# Patient Record
Sex: Male | Born: 1965 | Hispanic: No | Marital: Married | Smoking: Never smoker
Health system: Southern US, Community
[De-identification: ages and names within clinical notes are randomized; demographics above are authoritative.]

## PROBLEM LIST (undated history)

## (undated) DIAGNOSIS — K922 Gastrointestinal hemorrhage, unspecified: Secondary | ICD-10-CM

## (undated) DIAGNOSIS — D649 Anemia, unspecified: Secondary | ICD-10-CM

## (undated) DIAGNOSIS — K219 Gastro-esophageal reflux disease without esophagitis: Secondary | ICD-10-CM

---

## 2014-05-10 ENCOUNTER — Encounter (HOSPITAL_COMMUNITY): Payer: Self-pay

## 2014-05-10 ENCOUNTER — Observation Stay (HOSPITAL_COMMUNITY)
Admission: AD | Admit: 2014-05-10 | Discharge: 2014-05-12 | Disposition: A | Discharge status: Home / Self Care | Payer: Medicaid Other | Source: Ambulatory Visit | Attending: Cardiovascular Disease | Admitting: Cardiovascular Disease

## 2014-05-10 ENCOUNTER — Observation Stay (HOSPITAL_COMMUNITY): Payer: Medicaid Other

## 2014-05-10 DIAGNOSIS — K921 Melena: Principal | ICD-10-CM | POA: Insufficient documentation

## 2014-05-10 DIAGNOSIS — E785 Hyperlipidemia, unspecified: Secondary | ICD-10-CM | POA: Insufficient documentation

## 2014-05-10 DIAGNOSIS — D649 Anemia, unspecified: Secondary | ICD-10-CM | POA: Insufficient documentation

## 2014-05-10 DIAGNOSIS — K269 Duodenal ulcer, unspecified as acute or chronic, without hemorrhage or perforation: Secondary | ICD-10-CM | POA: Insufficient documentation

## 2014-05-10 DIAGNOSIS — A048 Other specified bacterial intestinal infections: Secondary | ICD-10-CM | POA: Insufficient documentation

## 2014-05-10 DIAGNOSIS — K2940 Chronic atrophic gastritis without bleeding: Secondary | ICD-10-CM | POA: Insufficient documentation

## 2014-05-10 DIAGNOSIS — K5900 Constipation, unspecified: Secondary | ICD-10-CM | POA: Insufficient documentation

## 2014-05-10 DIAGNOSIS — K219 Gastro-esophageal reflux disease without esophagitis: Secondary | ICD-10-CM | POA: Insufficient documentation

## 2014-05-10 DIAGNOSIS — E119 Type 2 diabetes mellitus without complications: Secondary | ICD-10-CM | POA: Insufficient documentation

## 2014-05-10 DIAGNOSIS — K922 Gastrointestinal hemorrhage, unspecified: Secondary | ICD-10-CM | POA: Diagnosis present

## 2014-05-10 DIAGNOSIS — E8809 Other disorders of plasma-protein metabolism, not elsewhere classified: Secondary | ICD-10-CM | POA: Diagnosis not present

## 2014-05-10 HISTORY — DX: Gastro-esophageal reflux disease without esophagitis: K21.9

## 2014-05-10 HISTORY — DX: Gastrointestinal hemorrhage, unspecified: K92.2

## 2014-05-10 HISTORY — DX: Anemia, unspecified: D64.9

## 2014-05-10 LAB — CBC WITH DIFFERENTIAL/PLATELET
Basophils Absolute: 0 10*3/uL (ref 0.0–0.1)
Basophils Relative: 1 % (ref 0–1)
EOS PCT: 1 % (ref 0–5)
Eosinophils Absolute: 0.1 10*3/uL (ref 0.0–0.7)
HCT: 15.9 % — ABNORMAL LOW (ref 39.0–52.0)
HEMOGLOBIN: 5.1 g/dL — AB (ref 13.0–17.0)
LYMPHS ABS: 2.5 10*3/uL (ref 0.7–4.0)
LYMPHS PCT: 30 % (ref 12–46)
MCH: 29.8 pg (ref 26.0–34.0)
MCHC: 32.1 g/dL (ref 30.0–36.0)
MCV: 93 fL (ref 78.0–100.0)
Monocytes Absolute: 0.6 10*3/uL (ref 0.1–1.0)
Monocytes Relative: 7 % (ref 3–12)
NEUTROS PCT: 61 % (ref 43–77)
Neutro Abs: 5.2 10*3/uL (ref 1.7–7.7)
Platelets: 233 10*3/uL (ref 150–400)
RBC: 1.71 MIL/uL — AB (ref 4.22–5.81)
RDW: 14.6 % (ref 11.5–15.5)
WBC: 8.5 10*3/uL (ref 4.0–10.5)

## 2014-05-10 LAB — GLUCOSE, CAPILLARY: Glucose-Capillary: 160 mg/dL — ABNORMAL HIGH (ref 70–99)

## 2014-05-10 LAB — COMPREHENSIVE METABOLIC PANEL
ALBUMIN: 3 g/dL — AB (ref 3.5–5.2)
ALT: 22 U/L (ref 0–53)
AST: 17 U/L (ref 0–37)
Alkaline Phosphatase: 55 U/L (ref 39–117)
Anion gap: 11 (ref 5–15)
BUN: 9 mg/dL (ref 6–23)
CO2: 23 mEq/L (ref 19–32)
Calcium: 7.9 mg/dL — ABNORMAL LOW (ref 8.4–10.5)
Chloride: 104 mEq/L (ref 96–112)
Creatinine, Ser: 0.78 mg/dL (ref 0.50–1.35)
GFR calc Af Amer: >90 mL/min (ref >90)
GFR calc non Af Amer: >90 mL/min (ref >90)
Glucose, Bld: 191 mg/dL — ABNORMAL HIGH (ref 70–99)
Potassium: 3.7 mEq/L (ref 3.7–5.3)
SODIUM: 138 meq/L (ref 137–147)
Total Bilirubin: <0.2 mg/dL — ABNORMAL LOW (ref 0.3–1.2)
Total Protein: 5.4 g/dL — ABNORMAL LOW (ref 6.0–8.3)

## 2014-05-10 LAB — PROTIME-INR
INR: 1.12 (ref 0.00–1.49)
PROTHROMBIN TIME: 14.4 s (ref 11.6–15.2)

## 2014-05-10 LAB — PREPARE RBC (CROSSMATCH)

## 2014-05-10 LAB — OCCULT BLOOD X 1 CARD TO LAB, STOOL: FECAL OCCULT BLD: POSITIVE — AB

## 2014-05-10 LAB — ABO/RH: ABO/RH(D): B POS

## 2014-05-10 LAB — APTT: aPTT: 25 seconds (ref 24–37)

## 2014-05-10 MED ORDER — PANTOPRAZOLE SODIUM 40 MG IV SOLR
40.0000 mg | INTRAVENOUS | Status: DC
  Administered 2014-05-10 – 2014-05-11 (×2): 40 mg via INTRAVENOUS
  Filled 2014-05-10 (×3): qty 40

## 2014-05-10 MED ORDER — SODIUM CHLORIDE 0.9 % IJ SOLN
3.0000 mL | Freq: Two times a day (BID) | INTRAMUSCULAR | Status: DC
  Administered 2014-05-10 – 2014-05-12 (×2): 3 mL via INTRAVENOUS

## 2014-05-10 MED ORDER — ACETAMINOPHEN 650 MG RE SUPP: 650.0000 mg | Freq: Four times a day (QID) | RECTAL | Status: DC | PRN

## 2014-05-10 MED ORDER — ONDANSETRON HCL 4 MG/2ML IJ SOLN: 4.0000 mg | Freq: Four times a day (QID) | INTRAMUSCULAR | Status: DC | PRN

## 2014-05-10 MED ORDER — ACETAMINOPHEN 325 MG PO TABS: 650.0000 mg | ORAL_TABLET | Freq: Four times a day (QID) | ORAL | Status: DC | PRN

## 2014-05-10 MED ORDER — SODIUM CHLORIDE 0.9 % IV SOLN
INTRAVENOUS | Status: DC
  Administered 2014-05-10 – 2014-05-11 (×3): via INTRAVENOUS
  Administered 2014-05-12: 1000 mL via INTRAVENOUS

## 2014-05-10 MED ORDER — ONDANSETRON HCL 4 MG PO TABS
4.0000 mg | ORAL_TABLET | Freq: Four times a day (QID) | ORAL | Status: DC | PRN
Start: 2014-05-10 — End: 2014-05-12

## 2014-05-10 NOTE — Consult Note (Signed)
Unassigned patient Reason for Consult: Black stools and anemia.  Referring Physician: Dr. Dixie Dials.  Allen Baldwin is an 48 y.o. male.  HPI: 48 year old [place] male, with a 2 day history of black tarry stools. He claims this happened once about 2 years ago but he did not make much of it. He suffers from constipation and uses herbs to help his symptoms. He denies having any BRBPR. He has taken an occasional Aleve to help with aches and pains but denies abusing NSAIDS. He has a good appetite and his weight has been stable. He has occasional periumbilical cramping before a BM but denies having any dysphagia, odynophagia, acid reflux, nausea or vomiting. He claims he has been very healthy all his life and has never had any medical problems. There is no known family history of colon cancer, celiac sprue or IBD.    Past Medical History  Diagnosis Date  . GERD (gastroesophageal reflux disease)    History reviewed. No pertinent past surgical history.  History reviewed. No pertinent family history.  Social History:  reports that he has never smoked. He quit smokeless tobacco use 4 days ago. His uses chewing tobacco. He reports that he does not drink alcohol or use illicit drugs. He works as a Scientist, clinical (histocompatibility and immunogenetics) at [employer].  Allergies: No Known Allergies  Medications: I have reviewed the patient's current medications.  Results for orders placed during the hospital encounter of 05/10/14 (from the past 48 hour(s))  OCCULT BLOOD X 1 CARD TO LAB, STOOL     Status: Abnormal   Collection Time    05/10/14 12:43 PM      Result Value Ref Range   Fecal Occult Bld POSITIVE (*) NEGATIVE  COMPREHENSIVE METABOLIC PANEL     Status: Abnormal   Collection Time    05/10/14  2:15 PM      Result Value Ref Range   Sodium 138  137 - 147 mEq/L   Potassium 3.7  3.7 - 5.3 mEq/L   Chloride 104  96 - 112 mEq/L   CO2 23  19 - 32 mEq/L   Glucose, Bld 191 (*) 70 - 99 mg/dL   BUN 9  6 - 23 mg/dL   Creatinine, Ser  0.78  0.50 - 1.35 mg/dL   Calcium 7.9 (*) 8.4 - 10.5 mg/dL   Total Protein 5.4 (*) 6.0 - 8.3 g/dL   Albumin 3.0 (*) 3.5 - 5.2 g/dL   AST 17  0 - 37 U/L   ALT 22  0 - 53 U/L   Alkaline Phosphatase 55  39 - 117 U/L   Total Bilirubin <0.2 (*) 0.3 - 1.2 mg/dL   GFR calc non Af Amer >90  >90 mL/min   GFR calc Af Amer >90  >90 mL/min   Comment: (NOTE)     The eGFR has been calculated using the CKD EPI equation.     This calculation has not been validated in all clinical situations.     eGFR's persistently <90 mL/min signify possible Chronic Kidney     Disease.   Anion gap 11  5 - 15  CBC WITH DIFFERENTIAL     Status: Abnormal   Collection Time    05/10/14  2:15 PM      Result Value Ref Range   WBC 8.5  4.0 - 10.5 K/uL   RBC 1.71 (*) 4.22 - 5.81 MIL/uL   Hemoglobin 5.1 (*) 13.0 - 17.0 g/dL   Comment: REPEATED TO VERIFY  SPECIMEN CHECKED FOR CLOTS     K MCALL,RN 1506 05/10/14 WBOND   HCT 15.9 (*) 39.0 - 52.0 %   MCV 93.0  78.0 - 100.0 fL   MCH 29.8  26.0 - 34.0 pg   MCHC 32.1  30.0 - 36.0 g/dL   RDW 14.6  11.5 - 15.5 %   Platelets 233  150 - 400 K/uL   Neutrophils Relative % 61  43 - 77 %   Neutro Abs 5.2  1.7 - 7.7 K/uL   Lymphocytes Relative 30  12 - 46 %   Lymphs Abs 2.5  0.7 - 4.0 K/uL   Monocytes Relative 7  3 - 12 %   Monocytes Absolute 0.6  0.1 - 1.0 K/uL   Eosinophils Relative 1  0 - 5 %   Eosinophils Absolute 0.1  0.0 - 0.7 K/uL   Basophils Relative 1  0 - 1 %   Basophils Absolute 0.0  0.0 - 0.1 K/uL  PROTIME-INR     Status: None   Collection Time    05/10/14  2:15 PM      Result Value Ref Range   Prothrombin Time 14.4  11.6 - 15.2 seconds   INR 1.12  0.00 - 1.49  APTT     Status: None   Collection Time    05/10/14  2:15 PM      Result Value Ref Range   aPTT 25  24 - 37 seconds  TYPE AND SCREEN     Status: None   Collection Time    05/10/14  2:15 PM      Result Value Ref Range   ABO/RH(D) B POS     Antibody Screen NEG     Sample Expiration 05/13/2014      Unit Number [id number]     Blood Component Type RED CELLS,LR     Unit division 00     Status of Unit ISSUED     Transfusion Status OK TO TRANSFUSE     Crossmatch Result Compatible     Unit Number [id number]     Blood Component Type RED CELLS,LR     Unit division 00     Status of Unit ALLOCATED     Transfusion Status OK TO TRANSFUSE     Crossmatch Result Compatible    PREPARE RBC (CROSSMATCH)     Status: None   Collection Time    05/10/14  2:15 PM      Result Value Ref Range   Order Confirmation ORDER PROCESSED BY BLOOD BANK    ABO/RH     Status: None   Collection Time    05/10/14  2:15 PM      Result Value Ref Range   ABO/RH(D) B POS     X-ray Chest Pa And Lateral   05/10/2014   CLINICAL DATA:  Dizziness  EXAM: CHEST  2 VIEW  COMPARISON:  None.  FINDINGS: Lungs are clear. Heart size and pulmonary vascularity are normal. No adenopathy. There is mild upper thoracic levoscoliosis.  IMPRESSION: No edema or consolidation.   Electronically Signed   By: Lowella Grip M.D.   On: 05/10/2014 13:16   Review of Systems  Constitutional: Negative.   HENT: Negative.   Eyes: Negative.   Respiratory: Negative.   Cardiovascular: Negative.   Gastrointestinal: Positive for constipation, blood in stool and melena. Negative for heartburn, nausea, vomiting, abdominal pain and diarrhea.  Genitourinary: Negative.   Skin: Negative.   Neurological: Negative.   Endo/Heme/Allergies: Negative.  Psychiatric/Behavioral: Negative.    Blood pressure 107/60, pulse 102, temperature 99.4 F (37.4 C), temperature source Oral, resp. rate 16, height 5' 7.5" (1.715 m), weight 65.772 kg (145 lb), SpO2 100.00%. Physical Exam  Constitutional: He is oriented to person, place, and time. He appears well-developed and well-nourished.  HENT:  Head: Normocephalic and atraumatic.  Eyes: Conjunctivae and EOM are normal. Pupils are equal, round, and reactive to light.  Neck: Normal range of motion. Neck  supple.  Cardiovascular: Normal rate and regular rhythm.   Respiratory: Effort normal and breath sounds normal.  GI: Soft. Bowel sounds are normal.  Musculoskeletal: Normal range of motion.  Neurological: He is alert and oriented to person, place, and time.  Skin: Skin is warm and dry.  Psychiatric: He has a normal mood and affect. His behavior is normal. Judgment and thought content normal.   Assessment/Plan: 1) Melenic stools with severe anemia: Will plan to do an EGD tomorrow. Transfuse 2 units of PRBC's.  2) Constipation-Colace 100 mg 2 PO QHS.  3) Hyperglycemia rule out diabetes; check fasting HbA1c.  4) Hypocalcemia/hypoalbuminemia.  Allen Baldwin 05/10/2014, 5:29 PM

## 2014-05-10 NOTE — H&P (Signed)
Referring Physician:  Maris BergerMitulkumar Baldwin is an 48 y.o. male.                       Chief Complaint: Dizziness  HPI: 48 year old male with 5 day history of dark stools and weakness and dizziness. Patient appears pale.  No past medical history on file. No history of diabetes, hypertension, prior GI or GU bleed. No smoking or alcohol intake.   No past surgical history on file.  No family history on file. Social History:  has no tobacco, alcohol, and drug history on file.  Allergies: Allergies not on file  No prescriptions prior to admission    No results found for this or any previous visit (from the past 48 hour(s)). No results found.  Review Of Systems No weight gain or loss, No vision change, No Asthma, No COPD, + dizziness, + chest pain, No GU bleed, No hepatitis, stroke or seizures or psychiatric admission.   Blood pressure 104/63, pulse 96, temperature 98.2 F (36.8 C), temperature source Oral, resp. rate 16, height 5' 7.5" (1.715 m), weight 65.772 kg (145 lb), SpO2 100.00%.  GENERAL: The patient is averagely built and nourished male in no acute distress.  HEENT: The patient is normocephalic, atraumatic with brown eyes. Pupils equally reactive to light. Extraocular movement intact. The patient wears glasses. Conjunctivae pale. Sclerae nonicteric.   NECK: No JVD, no carotid bruit.  LUNGS: Clear bilaterally.  HEART: Normal S1 and S2. II/VI systolic murmur.  ABDOMEN: Soft and nontender.  EXTREMITIES: No edema, cyanosis, or clubbing.  CNS: Cranial nerves grossly intact. Bilateral equal grips. The patient is right-handed.  SKIN: Dry and pale.  Assessment/Plan Acute upper GI bleed Severe anemia  Place in observation/Transfuse blood/GI consult post admission blood work results.  Ricki RodriguezKADAKIA,Charleen Madera S, MD  05/10/2014, 11:18 AM

## 2014-05-11 ENCOUNTER — Encounter (HOSPITAL_COMMUNITY)
Admission: AD | Disposition: A | Discharge status: Home / Self Care | Payer: Self-pay | Source: Ambulatory Visit | Attending: Cardiovascular Disease

## 2014-05-11 ENCOUNTER — Encounter (HOSPITAL_COMMUNITY): Payer: Self-pay | Admitting: *Deleted

## 2014-05-11 DIAGNOSIS — K921 Melena: Secondary | ICD-10-CM | POA: Diagnosis not present

## 2014-05-11 HISTORY — PX: ESOPHAGOGASTRODUODENOSCOPY: SHX5428

## 2014-05-11 LAB — PREPARE RBC (CROSSMATCH)

## 2014-05-11 LAB — BASIC METABOLIC PANEL
Anion gap: 8 (ref 5–15)
BUN: 7 mg/dL (ref 6–23)
CALCIUM: 8 mg/dL — AB (ref 8.4–10.5)
CO2: 24 mEq/L (ref 19–32)
Chloride: 109 mEq/L (ref 96–112)
Creatinine, Ser: 0.81 mg/dL (ref 0.50–1.35)
GFR calc Af Amer: >90 mL/min (ref >90)
GLUCOSE: 155 mg/dL — AB (ref 70–99)
Potassium: 4.1 mEq/L (ref 3.7–5.3)
Sodium: 141 mEq/L (ref 137–147)

## 2014-05-11 LAB — CBC
HEMATOCRIT: 23.8 % — AB (ref 39.0–52.0)
Hemoglobin: 7.9 g/dL — ABNORMAL LOW (ref 13.0–17.0)
MCH: 28.7 pg (ref 26.0–34.0)
MCHC: 33.2 g/dL (ref 30.0–36.0)
MCV: 86.5 fL (ref 78.0–100.0)
Platelets: 226 10*3/uL (ref 150–400)
RBC: 2.75 MIL/uL — ABNORMAL LOW (ref 4.22–5.81)
RDW: 18.6 % — ABNORMAL HIGH (ref 11.5–15.5)
WBC: 6.8 10*3/uL (ref 4.0–10.5)

## 2014-05-11 SURGERY — EGD (ESOPHAGOGASTRODUODENOSCOPY)
Anesthesia: Moderate Sedation | Laterality: Left

## 2014-05-11 MED ORDER — METFORMIN HCL 500 MG PO TABS
250.0000 mg | ORAL_TABLET | Freq: Every day | ORAL | Status: DC
  Administered 2014-05-12: 250 mg via ORAL
  Filled 2014-05-11 (×3): qty 1

## 2014-05-11 MED ORDER — SODIUM CHLORIDE 0.9 % IV SOLN: INTRAVENOUS | Status: DC

## 2014-05-11 MED ORDER — DIPHENHYDRAMINE HCL 50 MG/ML IJ SOLN
INTRAMUSCULAR | Status: AC
  Filled 2014-05-11: qty 1

## 2014-05-11 MED ORDER — FENTANYL CITRATE 0.05 MG/ML IJ SOLN
INTRAMUSCULAR | Status: AC
  Filled 2014-05-11: qty 2

## 2014-05-11 MED ORDER — FENTANYL CITRATE 0.05 MG/ML IJ SOLN
INTRAMUSCULAR | Status: DC | PRN
  Administered 2014-05-11 (×2): 25 ug via INTRAVENOUS

## 2014-05-11 MED ORDER — SODIUM CHLORIDE 0.9 % IV SOLN: Freq: Once | INTRAVENOUS | Status: DC

## 2014-05-11 MED ORDER — MIDAZOLAM HCL 10 MG/2ML IJ SOLN
INTRAMUSCULAR | Status: DC | PRN
  Administered 2014-05-11 (×2): 2 mg via INTRAVENOUS

## 2014-05-11 MED ORDER — MIDAZOLAM HCL 5 MG/ML IJ SOLN
INTRAMUSCULAR | Status: AC
  Filled 2014-05-11: qty 2

## 2014-05-11 NOTE — Progress Notes (Signed)
Ref: No PCP Per Patient   Subjective:  Feeling better. EGD today. Has healing duodenal ulcer.   Objective:  Vital Signs in the last 24 hours: Temp:  [98.1 F (36.7 C)-99.5 F (37.5 C)] 98.1 F (36.7 C) (08/13 1315) Pulse Rate:  [68-110] 68 (08/13 1514) Cardiac Rhythm:  [-]  Resp:  [10-24] 13 (08/13 1514) BP: (98-128)/(59-82) 104/67 mmHg (08/13 1514) SpO2:  [100 %] 100 % (08/13 1514) Weight:  [65 kg (143 lb 4.8 oz)] 65 kg (143 lb 4.8 oz) (08/13 0547)  Physical Exam: BP Readings from Last 1 Encounters:  05/11/14 104/67    Wt Readings from Last 1 Encounters:  05/11/14 65 kg (143 lb 4.8 oz)    Weight change:   HEENT: [place]/AT, Eyes-Brown, PERL, EOMI, Conjunctiva-Pale, Sclera-Non-icteric Neck: No JVD, No bruit, Trachea midline. Lungs:  Clear, Bilateral. Cardiac:  Regular rhythm, normal S1 and S2, no S3.  Abdomen:  Soft, non-tender. Extremities:  No edema present. No cyanosis. No clubbing. CNS: AxOx3, Cranial nerves grossly intact, moves all 4 extremities. Right handed. Skin: Warm and dry.   Intake/Output from previous day: 08/12 0701 - 08/13 0700 In: 1317.5 [I.V.:22.5; Blood:1295] Out: 2000 [Urine:2000]    Lab Results: BMET    Component Value Date/Time   NA 141 05/11/2014 0508   NA 138 05/10/2014 1415   K 4.1 05/11/2014 0508   K 3.7 05/10/2014 1415   CL 109 05/11/2014 0508   CL 104 05/10/2014 1415   CO2 24 05/11/2014 0508   CO2 23 05/10/2014 1415   GLUCOSE 155* 05/11/2014 0508   GLUCOSE 191* 05/10/2014 1415   BUN 7 05/11/2014 0508   BUN 9 05/10/2014 1415   CREATININE 0.81 05/11/2014 0508   CREATININE 0.78 05/10/2014 1415   CALCIUM 8.0* 05/11/2014 0508   CALCIUM 7.9* 05/10/2014 1415   GFRNONAA >90 05/11/2014 0508   GFRNONAA >90 05/10/2014 1415   GFRAA >90 05/11/2014 0508   GFRAA >90 05/10/2014 1415   CBC    Component Value Date/Time   WBC 6.8 05/11/2014 0508   RBC 2.75* 05/11/2014 0508   HGB 7.9* 05/11/2014 0508   HCT 23.8* 05/11/2014 0508   PLT 226 05/11/2014 0508   MCV 86.5  05/11/2014 0508   MCH 28.7 05/11/2014 0508   MCHC 33.2 05/11/2014 0508   RDW 18.6* 05/11/2014 0508   LYMPHSABS 2.5 05/10/2014 1415   MONOABS 0.6 05/10/2014 1415   EOSABS 0.1 05/10/2014 1415   BASOSABS 0.0 05/10/2014 1415   HEPATIC Function Panel  Recent Labs  05/10/14 1415  PROT 5.4*   HEMOGLOBIN A1C No components found with this basename: HGA1C,  MPG   CARDIAC ENZYMES No results found for this basename: CKTOTAL, CKMB, CKMBINDEX, TROPONINI   BNP No results found for this basename: PROBNP,  in the last 8760 hours TSH No results found for this basename: TSH,  in the last 8760 hours CHOLESTEROL No results found for this basename: CHOL,  in the last 8760 hours  Scheduled Meds: . sodium chloride   Intravenous Once  . metFORMIN  250 mg Oral Q breakfast  . pantoprazole (PROTONIX) IV  40 mg Intravenous Q24H  . sodium chloride  3 mL Intravenous Q12H   Continuous Infusions: . sodium chloride 75 mL/hr at 05/11/14 1238   PRN Meds:.acetaminophen, acetaminophen, ondansetron (ZOFRAN) IV, ondansetron  Assessment/Plan: Acute upper GI bleed Healing duodenal ulcer  Severe anemia  Continue Protonix. Patient understood dietary changes and possible triple antibiotic treatment if H. Pylori is positive. Also understood to use  Google and understand the disease and use relaxing Yogas to decrease stress with lifestyle modification and GI follow up.   LOS: 1 day    Orpah Cobb  MD  05/11/2014, 5:52 PM

## 2014-05-11 NOTE — Interval H&P Note (Signed)
History and Physical Interval Note:  05/11/2014 1:53 PM  Allen Baldwin  has presented today for surgery, with the diagnosis of Melena and anemia  The various methods of treatment have been discussed with the patient and family. After consideration of risks, benefits and other options for treatment, the patient has consented to  Procedure(s): ESOPHAGOGASTRODUODENOSCOPY (EGD) (Left) as a surgical intervention .  The patient's history has been reviewed, patient examined, no change in status, stable for surgery.  I have reviewed the patient's chart and labs.  Questions were answered to the patient's satisfaction.     [name] D

## 2014-05-11 NOTE — Progress Notes (Signed)
UR Completed Arnetia Bronk Graves-Bigelow, RN,BSN [phone]  

## 2014-05-11 NOTE — Op Note (Signed)
[facility] [address] [place], [id number]   OPERATIVE PROCEDURE REPORT  PATIENT: Allen Baldwin, Allen Baldwin  MR#: [id number] BIRTHDATE: Dec 12, 1965  GENDER: Male ENDOSCOPIST: Jeani HawkingPatrick Lanijah Warzecha, MD ASSISTANT:   Nilsa NuttingFelicia Akande, Endo Technician and Felecia ShellingWendy Myers, RN PROCEDURE DATE: 05/11/2014 PROCEDURE:   EGD w/ biopsy ASA CLASS:   Class II INDICATIONS:Melena. MEDICATIONS: Versed 4 mg IV and Fentanyl 50 mcg IV TOPICAL ANESTHETIC:   none  DESCRIPTION OF PROCEDURE:   After the risks benefits and alternatives of the procedure were thoroughly explained, informed consent was obtained.  The PENTAX GASTOROSCOPE [id number]  endoscope was introduced through the mouth  and advanced to the second portion of the duodenum Without limitations.      The instrument was slowly withdrawn as the mucosa was fully examined.      FINDINGS:  The esohpagus was normal.  No evidence of any esohpagitis.  The gastric lumen was also normal, i.e., no evidence of inflammation, ulcerations, erosions, masses, or vascular abnormalities.  The duodenal bulb reveals a healing ulcer with some surrouding inflammation.  Cold biopsies of the gastric mucosa were obtained to check for H. pylori.   Retroflexed views revealed no abnormalities.     The scope was then withdrawn from the patient and the procedure terminated.  COMPLICATIONS: There were no complications.  IMPRESSION: 1) Healing duodenal bulb ulcer.  RECOMMENDATIONS: 1) No NSAIDs. 2) PPI QD indefinitely. 3) Await biopsy results. 4) Okay to D/C Home.  Follow up with PCP.  _______________________________ eSigned:  Jeani HawkingPatrick Zynasia Burklow, MD 05/11/2014 2:49 PM

## 2014-05-11 NOTE — H&P (View-Only) (Signed)
Unassigned patient Reason for Consult: Black stools and anemia.  Referring Physician: Dr. Dixie Dials.  Allen Baldwin is an 48 y.o. male.  HPI: 48 year old [place] male, with a 2 day history of black tarry stools. He claims this happened once about 2 years ago but he did not make much of it. He suffers from constipation and uses herbs to help his symptoms. He denies having any BRBPR. He has taken an occasional Aleve to help with aches and pains but denies abusing NSAIDS. He has a good appetite and his weight has been stable. He has occasional periumbilical cramping before a BM but denies having any dysphagia, odynophagia, acid reflux, nausea or vomiting. He claims he has been very healthy all his life and has never had any medical problems. There is no known family history of colon cancer, celiac sprue or IBD.    Past Medical History  Diagnosis Date  . GERD (gastroesophageal reflux disease)    History reviewed. No pertinent past surgical history.  History reviewed. No pertinent family history.  Social History:  reports that he has never smoked. He quit smokeless tobacco use 4 days ago. His uses chewing tobacco. He reports that he does not drink alcohol or use illicit drugs. He works as a Scientist, clinical (histocompatibility and immunogenetics) at [employer].  Allergies: No Known Allergies  Medications: I have reviewed the patient's current medications.  Results for orders placed during the hospital encounter of 05/10/14 (from the past 48 hour(s))  OCCULT BLOOD X 1 CARD TO LAB, STOOL     Status: Abnormal   Collection Time    05/10/14 12:43 PM      Result Value Ref Range   Fecal Occult Bld POSITIVE (*) NEGATIVE  COMPREHENSIVE METABOLIC PANEL     Status: Abnormal   Collection Time    05/10/14  2:15 PM      Result Value Ref Range   Sodium 138  137 - 147 mEq/L   Potassium 3.7  3.7 - 5.3 mEq/L   Chloride 104  96 - 112 mEq/L   CO2 23  19 - 32 mEq/L   Glucose, Bld 191 (*) 70 - 99 mg/dL   BUN 9  6 - 23 mg/dL   Creatinine, Ser  0.78  0.50 - 1.35 mg/dL   Calcium 7.9 (*) 8.4 - 10.5 mg/dL   Total Protein 5.4 (*) 6.0 - 8.3 g/dL   Albumin 3.0 (*) 3.5 - 5.2 g/dL   AST 17  0 - 37 U/L   ALT 22  0 - 53 U/L   Alkaline Phosphatase 55  39 - 117 U/L   Total Bilirubin <0.2 (*) 0.3 - 1.2 mg/dL   GFR calc non Af Amer >90  >90 mL/min   GFR calc Af Amer >90  >90 mL/min   Comment: (NOTE)     The eGFR has been calculated using the CKD EPI equation.     This calculation has not been validated in all clinical situations.     eGFR's persistently <90 mL/min signify possible Chronic Kidney     Disease.   Anion gap 11  5 - 15  CBC WITH DIFFERENTIAL     Status: Abnormal   Collection Time    05/10/14  2:15 PM      Result Value Ref Range   WBC 8.5  4.0 - 10.5 K/uL   RBC 1.71 (*) 4.22 - 5.81 MIL/uL   Hemoglobin 5.1 (*) 13.0 - 17.0 g/dL   Comment: REPEATED TO VERIFY  SPECIMEN CHECKED FOR CLOTS     K MCALL,RN 1506 05/10/14 WBOND   HCT 15.9 (*) 39.0 - 52.0 %   MCV 93.0  78.0 - 100.0 fL   MCH 29.8  26.0 - 34.0 pg   MCHC 32.1  30.0 - 36.0 g/dL   RDW 14.6  11.5 - 15.5 %   Platelets 233  150 - 400 K/uL   Neutrophils Relative % 61  43 - 77 %   Neutro Abs 5.2  1.7 - 7.7 K/uL   Lymphocytes Relative 30  12 - 46 %   Lymphs Abs 2.5  0.7 - 4.0 K/uL   Monocytes Relative 7  3 - 12 %   Monocytes Absolute 0.6  0.1 - 1.0 K/uL   Eosinophils Relative 1  0 - 5 %   Eosinophils Absolute 0.1  0.0 - 0.7 K/uL   Basophils Relative 1  0 - 1 %   Basophils Absolute 0.0  0.0 - 0.1 K/uL  PROTIME-INR     Status: None   Collection Time    05/10/14  2:15 PM      Result Value Ref Range   Prothrombin Time 14.4  11.6 - 15.2 seconds   INR 1.12  0.00 - 1.49  APTT     Status: None   Collection Time    05/10/14  2:15 PM      Result Value Ref Range   aPTT 25  24 - 37 seconds  TYPE AND SCREEN     Status: None   Collection Time    05/10/14  2:15 PM      Result Value Ref Range   ABO/RH(D) B POS     Antibody Screen NEG     Sample Expiration 05/13/2014      Unit Number [id number]     Blood Component Type RED CELLS,LR     Unit division 00     Status of Unit ISSUED     Transfusion Status OK TO TRANSFUSE     Crossmatch Result Compatible     Unit Number [id number]     Blood Component Type RED CELLS,LR     Unit division 00     Status of Unit ALLOCATED     Transfusion Status OK TO TRANSFUSE     Crossmatch Result Compatible    PREPARE RBC (CROSSMATCH)     Status: None   Collection Time    05/10/14  2:15 PM      Result Value Ref Range   Order Confirmation ORDER PROCESSED BY BLOOD BANK    ABO/RH     Status: None   Collection Time    05/10/14  2:15 PM      Result Value Ref Range   ABO/RH(D) B POS     X-ray Chest Pa And Lateral   05/10/2014   CLINICAL DATA:  Dizziness  EXAM: CHEST  2 VIEW  COMPARISON:  None.  FINDINGS: Lungs are clear. Heart size and pulmonary vascularity are normal. No adenopathy. There is mild upper thoracic levoscoliosis.  IMPRESSION: No edema or consolidation.   Electronically Signed   By: William  Woodruff M.D.   On: 05/10/2014 13:16   Review of Systems  Constitutional: Negative.   HENT: Negative.   Eyes: Negative.   Respiratory: Negative.   Cardiovascular: Negative.   Gastrointestinal: Positive for constipation, blood in stool and melena. Negative for heartburn, nausea, vomiting, abdominal pain and diarrhea.  Genitourinary: Negative.   Skin: Negative.   Neurological: Negative.   Endo/Heme/Allergies: Negative.     Psychiatric/Behavioral: Negative.    Blood pressure 107/60, pulse 102, temperature 99.4 F (37.4 C), temperature source Oral, resp. rate 16, height 5' 7.5" (1.715 m), weight 65.772 kg (145 lb), SpO2 100.00%. Physical Exam  Constitutional: He is oriented to person, place, and time. He appears well-developed and well-nourished.  HENT:  Head: Normocephalic and atraumatic.  Eyes: Conjunctivae and EOM are normal. Pupils are equal, round, and reactive to light.  Neck: Normal range of motion. Neck  supple.  Cardiovascular: Normal rate and regular rhythm.   Respiratory: Effort normal and breath sounds normal.  GI: Soft. Bowel sounds are normal.  Musculoskeletal: Normal range of motion.  Neurological: He is alert and oriented to person, place, and time.  Skin: Skin is warm and dry.  Psychiatric: He has a normal mood and affect. His behavior is normal. Judgment and thought content normal.   Assessment/Plan: 1) Melenic stools with severe anemia: Will plan to do an EGD tomorrow. Transfuse 2 units of PRBC's.  2) Constipation-Colace 100 mg 2 PO QHS.  3) Hyperglycemia rule out diabetes; check fasting HbA1c.  4) Hypocalcemia/hypoalbuminemia.  Allen Baldwin 05/10/2014, 5:29 PM

## 2014-05-12 ENCOUNTER — Encounter (HOSPITAL_COMMUNITY): Payer: Self-pay | Admitting: Gastroenterology

## 2014-05-12 DIAGNOSIS — K921 Melena: Secondary | ICD-10-CM | POA: Diagnosis not present

## 2014-05-12 LAB — LIPID PANEL
CHOL/HDL RATIO: 4.6 ratio
CHOLESTEROL: 208 mg/dL — AB (ref 0–200)
HDL: 45 mg/dL (ref >39)
LDL Cholesterol: 99 mg/dL (ref 0–99)
Triglycerides: 319 mg/dL — ABNORMAL HIGH (ref <150)
VLDL: 64 mg/dL — AB (ref 0–40)

## 2014-05-12 LAB — TYPE AND SCREEN
ABO/RH(D): B POS
ANTIBODY SCREEN: NEGATIVE
UNIT DIVISION: 0
Unit division: 0
Unit division: 0

## 2014-05-12 LAB — CBC
HCT: 31.3 % — ABNORMAL LOW (ref 39.0–52.0)
Hemoglobin: 10.4 g/dL — ABNORMAL LOW (ref 13.0–17.0)
MCH: 28.9 pg (ref 26.0–34.0)
MCHC: 33.2 g/dL (ref 30.0–36.0)
MCV: 86.9 fL (ref 78.0–100.0)
PLATELETS: 265 10*3/uL (ref 150–400)
RBC: 3.6 MIL/uL — ABNORMAL LOW (ref 4.22–5.81)
RDW: 17.8 % — AB (ref 11.5–15.5)
WBC: 9.1 10*3/uL (ref 4.0–10.5)

## 2014-05-12 LAB — HEMOGLOBIN A1C
Hgb A1c MFr Bld: 6.5 % — ABNORMAL HIGH (ref <5.7)
Mean Plasma Glucose: 140 mg/dL — ABNORMAL HIGH (ref <117)

## 2014-05-12 MED ORDER — FERROUS GLUCONATE 324 (38 FE) MG PO TABS: 324.0000 mg | ORAL_TABLET | Freq: Every day | ORAL | Status: AC

## 2014-05-12 MED ORDER — PANTOPRAZOLE SODIUM 40 MG PO TBEC: 40.0000 mg | DELAYED_RELEASE_TABLET | Freq: Every day | ORAL | Status: AC

## 2014-05-12 MED ORDER — METFORMIN HCL 500 MG PO TABS: 250.0000 mg | ORAL_TABLET | Freq: Two times a day (BID) | ORAL | Status: AC

## 2014-05-12 NOTE — Progress Notes (Signed)
UR Completed Syrena Burges Graves-Bigelow, RN,BSN [phone]  

## 2014-05-12 NOTE — Progress Notes (Signed)
Physician Discharge Summary  Patient ID: Allen Baldwin MRN: [id number] DOB/AGE: 47/12/1965 47 y.o.  Admit date: 05/10/2014 Discharge date: 05/12/2014  Admission Diagnoses: Acute upper GI bleed  Severe anemia  Discharge Diagnoses:  Principle Problem: * Acute upper GI bleed * New onset DM, II Healing duodenal ulcer  Severe anemia Dyslipidemia  Discharged Condition: good  Hospital Course: 48 year old male had 5 day history of dark stools and weakness and dizziness. Patient appeared pale and weak. GI consult of Allen Baldwin was obtained, He underwent EGD by Allen Baldwin. It showed healing duodenal ulcer. He had total 3 units of blood. He was treated with IV Protonix. He was started on Protonix IV followed by PO. He was discharged home in stable condition. F/U Dr. Elnoria HowardHung in 1 month.  Consults: GI  Significant Diagnostic Studies: labs: Normal WBC and Platelets count. Low Hgb of 5.1. Post transfusion Hgb was 7.9 and 10.4. Also has low protein and calcium and high sugar of 191 mg/dL. Hgb A1C of 6.5  EGD showed healing duodenal ulcer.   Treatments: EGD and blood transfusions.  Discharge Exam: Blood pressure 107/66, pulse 78, temperature 98.4 F (36.9 C), temperature source Oral, resp. rate 16, height 5' 7.5" (1.715 m), weight 65.726 kg (144 lb 14.4 oz), SpO2 100.00%.  HEENT: [place]/AT, Eyes-Brown, PERL, EOMI, Conjunctiva-Pale, Sclera-Non-icteric  Neck: No JVD, No bruit, Trachea midline.  Lungs: Clear, Bilateral.  Cardiac: Regular rhythm, normal S1 and S2, no S3.  Abdomen: Soft, non-tender.  Extremities: No edema present. No cyanosis. No clubbing.  CNS: AxOx3, Cranial nerves grossly intact, moves all 4 extremities. Right handed.  Skin: Warm and dry.  Disposition: 01 Home or self care    Medication List    ASK your doctor about these medications       famotidine 20 MG tablet  Commonly known as:  PEPCID  Take 20 mg by mouth daily.     multivitamin with minerals Tabs tablet   Take 1 tablet by mouth daily.     OVER THE COUNTER MEDICATION  Take 1 tablet by mouth once. Day Quil/Night Quil multisymptom      Metformin 500 mg. 1/2 twice daily. Protonix 40 mg. one daily. Iron one daily.  SignedOrpah Cobb: Philippa Vessey S 05/12/2014, 4:28 PM

## 2014-05-13 NOTE — Discharge Summary (Signed)
Patient Information    Patient Name Sex DOB SSN   Allen Baldwin, Ezzard Male 05/29/1966 [id number]      Author: Ricki RodriguezAjay S Mirza Fessel, MD Service: Cardiology Author Type: Physician   Filed: 05/12/2014 4:43 PM Note Time: 05/12/2014 4:27 PM Status: Signed   Editor: Ricki RodriguezAjay S [name], MD (Physician)      Physician Discharge Summary    Patient ID:  Allen Baldwin  MRN: [id number]  DOB/AGE: 47/12/1965 48 y.o.   Admit date: 05/10/2014  Discharge date: 05/12/2014   Admission Diagnoses:  Acute upper GI bleed  Severe anemia   Discharge Diagnoses:  Principle Problem: * Acute upper GI bleed *  New onset DM, II  Healing duodenal ulcer  Severe anemia  Dyslipidemia   Discharged Condition: good   Hospital Course: 48 year old male had 5 day history of dark stools and weakness and dizziness. Patient appeared pale and weak. GI consult of Dr. Starleen ArmsJyoti Mann was obtained, He underwent EGD by Dr. Jeani HawkingPatrick Hung. It showed healing duodenal ulcer. He had total 3 units of blood. He was treated with IV Protonix. He was started on Protonix IV followed by PO. He was discharged home in stable condition. F/U Dr. Elnoria HowardHung in 1 month. See Dr. Izell Carolinahaval Patel per patient for primary care and diabetic care.  Consults: GI /Dr. Loreta AveMann and Dr. Elnoria HowardHung.  Significant Diagnostic Studies: labs: Normal WBC and Platelets count. Low Hgb of 5.1. Post transfusion Hgb was 7.9 and 10.4. Also has low protein and calcium and high sugar of 191 mg/dL. Hgb A1C of 6.5  EGD showed healing duodenal ulcer.   Treatments: EGD and blood transfusions.   Discharge Exam:  Blood pressure 107/66, pulse 78, temperature 98.4 F (36.9 C), temperature source Oral, resp. rate 16, height 5' 7.5" (1.715 m), weight 65.726 kg (144 lb 14.4 oz), SpO2 100.00%.  HEENT: [place]/AT, Eyes-Brown, PERL, EOMI, Conjunctiva-Pale, Sclera-Non-icteric  Neck: No JVD, No bruit, Trachea midline.  Lungs: Clear, Bilateral.  Cardiac: Regular rhythm, normal S1 and S2, no S3.  Abdomen: Soft, non-tender.   Extremities: No edema present. No cyanosis. No clubbing.  CNS: AxOx3, Cranial nerves grossly intact, moves all 4 extremities. Right handed.  Skin: Warm and dry.   Disposition: 01 Home or self care          Medication List         ASK your doctor about these medications           famotidine 20 MG tablet      Commonly known as: PEPCID      Take 20 mg by mouth daily.      multivitamin with minerals Tabs tablet      Take 1 tablet by mouth daily.      OVER THE COUNTER MEDICATION      Take 1 tablet by mouth once. Day Quil/Night Quil multisymptom         Metformin 500 mg. 1/2 twice daily.  Protonix 40 mg. one daily.  Iron one daily.   SignedOrpah Cobb:  Cornel Werber S  05/12/2014, 4:28 PM

## 2017-12-25 ENCOUNTER — Other Ambulatory Visit: Payer: Self-pay | Admitting: Cardiovascular Disease

## 2017-12-25 ENCOUNTER — Ambulatory Visit
Admission: RE | Admit: 2017-12-25 | Discharge: 2017-12-25 | Disposition: A | Discharge status: Home / Self Care | Payer: BLUE CROSS/BLUE SHIELD | Source: Ambulatory Visit | Attending: Cardiovascular Disease | Admitting: Cardiovascular Disease

## 2017-12-25 DIAGNOSIS — M79606 Pain in leg, unspecified: Secondary | ICD-10-CM

## 2019-05-17 IMAGING — CR DG LUMBAR SPINE COMPLETE 4+V
5 series · 5 of 5 positions shown · non-contrast
Comparison: None.

CLINICAL DATA: Lumbago

EXAM:
LUMBAR SPINE - COMPLETE 4+ VIEW

[t l-spine a.p.]
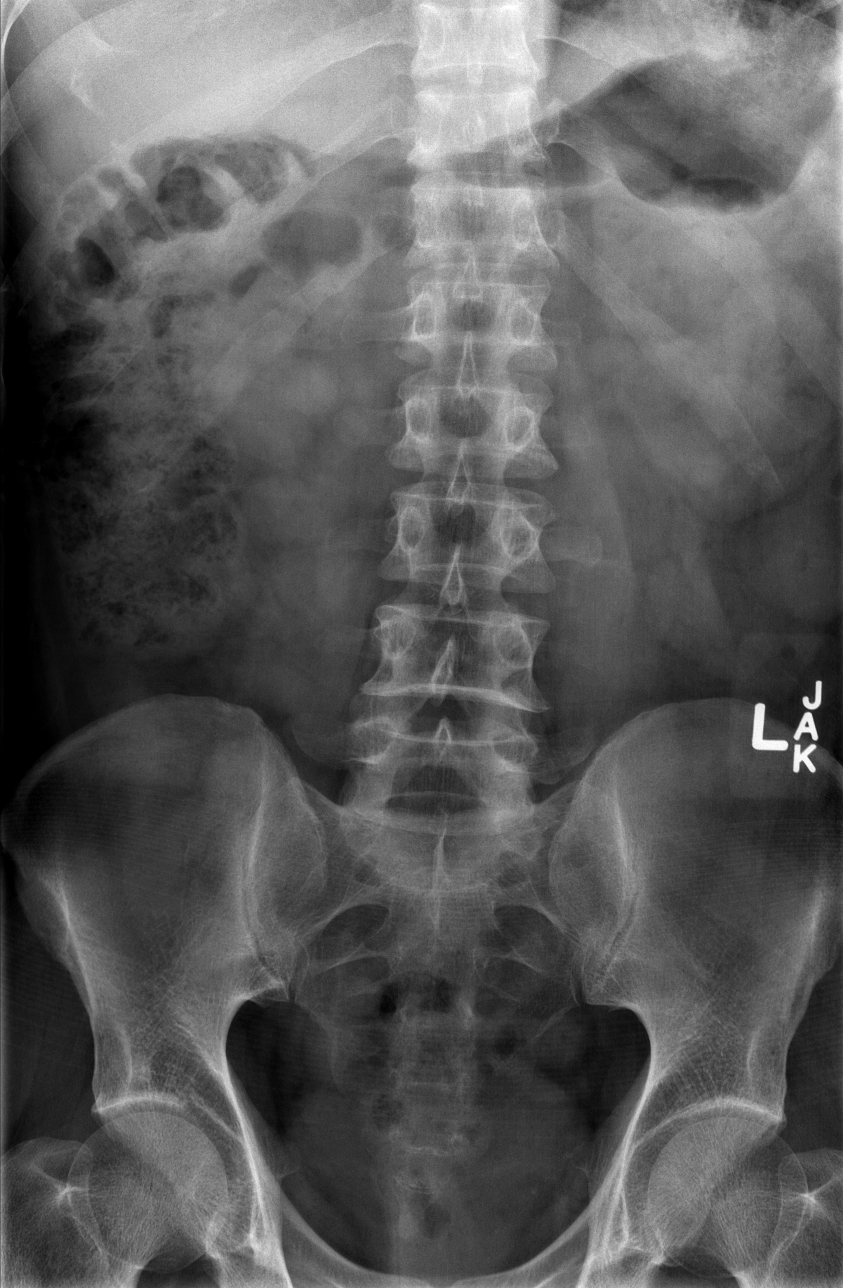

[t l-spine oblique exposure (1 of 2)]
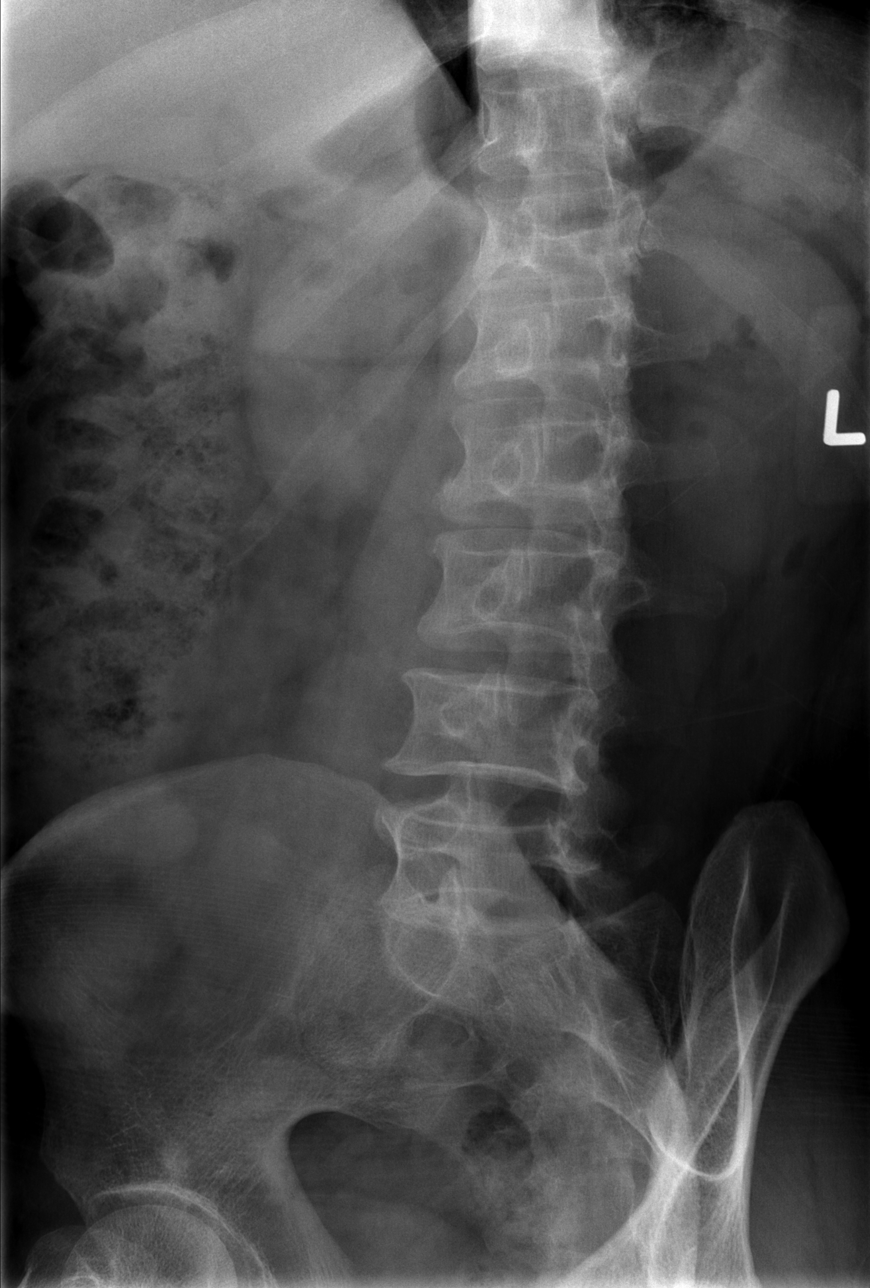

[t l-spine oblique exposure (2 of 2)]
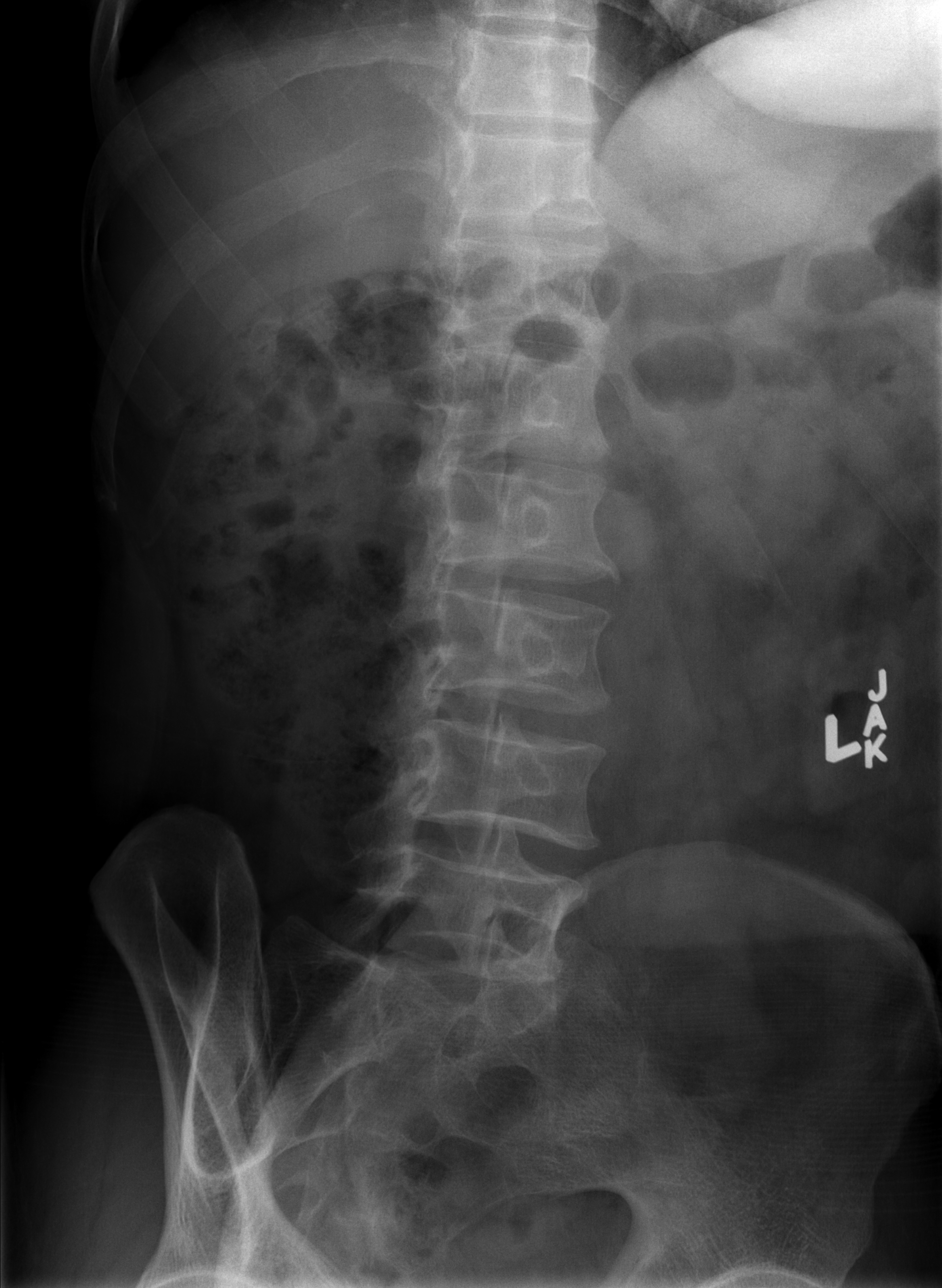

[t l-spine lat]
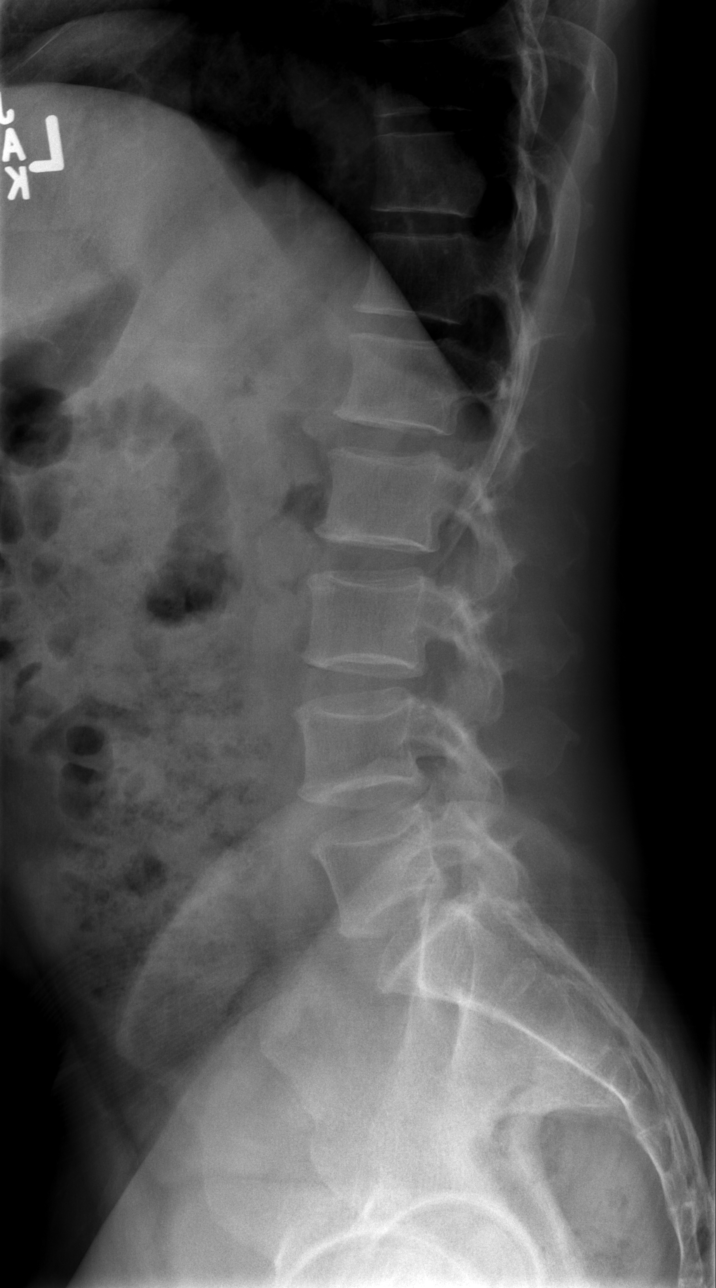

[t l-spine l5-s1 spot]
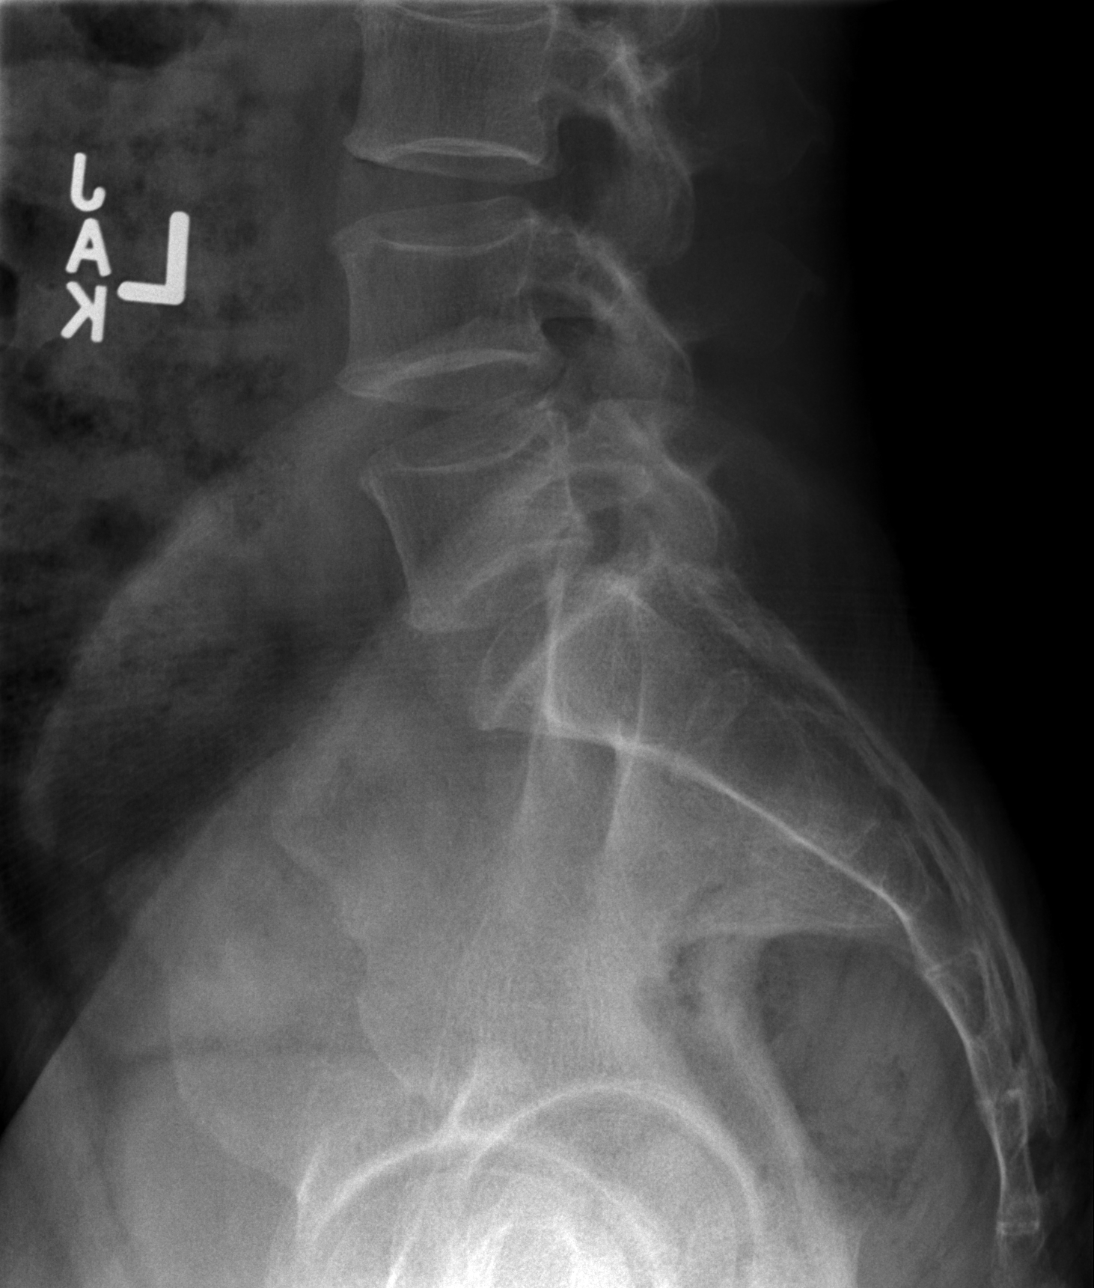

[5 of 5 positions shown; findings below may reference images not displayed]

FINDINGS: Frontal, lateral, spot lumbosacral lateral, and bilateral oblique
views were obtained. There are 5 non-rib-bearing lumbar type
vertebral bodies. There is no fracture or spondylolisthesis. The
disc spaces appear unremarkable. There is no appreciable facet
arthropathy.
IMPRESSION: No fracture or spondylolisthesis.  No evident arthropathy.

## 2023-01-05 DIAGNOSIS — I10 Essential (primary) hypertension: Secondary | ICD-10-CM | POA: Diagnosis not present

## 2023-01-05 DIAGNOSIS — J301 Allergic rhinitis due to pollen: Secondary | ICD-10-CM | POA: Diagnosis not present

## 2023-01-05 DIAGNOSIS — Z0001 Encounter for general adult medical examination with abnormal findings: Secondary | ICD-10-CM | POA: Diagnosis not present

## 2023-01-05 DIAGNOSIS — E1165 Type 2 diabetes mellitus with hyperglycemia: Secondary | ICD-10-CM | POA: Diagnosis not present

## 2023-01-05 DIAGNOSIS — Z125 Encounter for screening for malignant neoplasm of prostate: Secondary | ICD-10-CM | POA: Diagnosis not present

## 2023-01-05 DIAGNOSIS — E782 Mixed hyperlipidemia: Secondary | ICD-10-CM | POA: Diagnosis not present

## 2023-01-19 DIAGNOSIS — J301 Allergic rhinitis due to pollen: Secondary | ICD-10-CM | POA: Diagnosis not present

## 2023-01-19 DIAGNOSIS — I10 Essential (primary) hypertension: Secondary | ICD-10-CM | POA: Diagnosis not present

## 2023-01-19 DIAGNOSIS — E782 Mixed hyperlipidemia: Secondary | ICD-10-CM | POA: Diagnosis not present

## 2023-01-19 DIAGNOSIS — E1165 Type 2 diabetes mellitus with hyperglycemia: Secondary | ICD-10-CM | POA: Diagnosis not present

## 2023-07-13 DIAGNOSIS — E782 Mixed hyperlipidemia: Secondary | ICD-10-CM | POA: Diagnosis not present

## 2023-07-13 DIAGNOSIS — I10 Essential (primary) hypertension: Secondary | ICD-10-CM | POA: Diagnosis not present

## 2023-07-13 DIAGNOSIS — E1165 Type 2 diabetes mellitus with hyperglycemia: Secondary | ICD-10-CM | POA: Diagnosis not present

## 2023-07-13 DIAGNOSIS — J301 Allergic rhinitis due to pollen: Secondary | ICD-10-CM | POA: Diagnosis not present

## 2023-09-07 DIAGNOSIS — I10 Essential (primary) hypertension: Secondary | ICD-10-CM | POA: Diagnosis not present

## 2023-09-07 DIAGNOSIS — E1165 Type 2 diabetes mellitus with hyperglycemia: Secondary | ICD-10-CM | POA: Diagnosis not present

## 2023-09-07 DIAGNOSIS — E782 Mixed hyperlipidemia: Secondary | ICD-10-CM | POA: Diagnosis not present
